# Patient Record
Sex: Female | Born: 1965 | Race: Black or African American | Hispanic: No | Marital: Married | Smoking: Never smoker
Health system: Southern US, Community
[De-identification: ages and names within clinical notes are randomized; demographics above are authoritative.]

## PROBLEM LIST (undated history)

## (undated) DIAGNOSIS — I10 Essential (primary) hypertension: Secondary | ICD-10-CM

---

## 2017-06-15 ENCOUNTER — Other Ambulatory Visit: Payer: Self-pay

## 2017-06-15 ENCOUNTER — Emergency Department (HOSPITAL_BASED_OUTPATIENT_CLINIC_OR_DEPARTMENT_OTHER)
Admission: EM | Admit: 2017-06-15 | Discharge: 2017-06-16 | Disposition: A | Discharge status: Home / Self Care | Payer: No Typology Code available for payment source | Attending: Emergency Medicine | Admitting: Emergency Medicine

## 2017-06-15 ENCOUNTER — Emergency Department (HOSPITAL_BASED_OUTPATIENT_CLINIC_OR_DEPARTMENT_OTHER): Payer: No Typology Code available for payment source

## 2017-06-15 ENCOUNTER — Encounter (HOSPITAL_BASED_OUTPATIENT_CLINIC_OR_DEPARTMENT_OTHER): Payer: Self-pay | Admitting: Emergency Medicine

## 2017-06-15 DIAGNOSIS — Z79899 Other long term (current) drug therapy: Secondary | ICD-10-CM | POA: Diagnosis not present

## 2017-06-15 DIAGNOSIS — I10 Essential (primary) hypertension: Secondary | ICD-10-CM | POA: Diagnosis not present

## 2017-06-15 DIAGNOSIS — G4700 Insomnia, unspecified: Secondary | ICD-10-CM | POA: Diagnosis not present

## 2017-06-15 HISTORY — DX: Essential (primary) hypertension: I10

## 2017-06-15 LAB — CBC WITH DIFFERENTIAL/PLATELET
BASOS ABS: 0 10*3/uL (ref 0.0–0.1)
BASOS PCT: 0 %
Eosinophils Absolute: 0 10*3/uL (ref 0.0–0.7)
Eosinophils Relative: 0 %
HEMATOCRIT: 39 % (ref 36.0–46.0)
Hemoglobin: 13.3 g/dL (ref 12.0–15.0)
LYMPHS PCT: 23 %
Lymphs Abs: 3 10*3/uL (ref 0.7–4.0)
MCH: 30.6 pg (ref 26.0–34.0)
MCHC: 34.1 g/dL (ref 30.0–36.0)
MCV: 89.9 fL (ref 78.0–100.0)
Monocytes Absolute: 0.9 10*3/uL (ref 0.1–1.0)
Monocytes Relative: 7 %
NEUTROS ABS: 9.3 10*3/uL — AB (ref 1.7–7.7)
NEUTROS PCT: 70 %
Platelets: 249 10*3/uL (ref 150–400)
RBC: 4.34 MIL/uL (ref 3.87–5.11)
RDW: 12.9 % (ref 11.5–15.5)
WBC: 13.2 10*3/uL — AB (ref 4.0–10.5)

## 2017-06-15 LAB — COMPREHENSIVE METABOLIC PANEL
ALBUMIN: 3.7 g/dL (ref 3.5–5.0)
ALT: 25 U/L (ref 14–54)
AST: 23 U/L (ref 15–41)
Alkaline Phosphatase: 46 U/L (ref 38–126)
Anion gap: 9 (ref 5–15)
BILIRUBIN TOTAL: 0.4 mg/dL (ref 0.3–1.2)
BUN: 16 mg/dL (ref 6–20)
CHLORIDE: 107 mmol/L (ref 101–111)
CO2: 23 mmol/L (ref 22–32)
CREATININE: 0.9 mg/dL (ref 0.44–1.00)
Calcium: 8.6 mg/dL — ABNORMAL LOW (ref 8.9–10.3)
GFR calc Af Amer: >60 mL/min (ref >60)
GLUCOSE: 122 mg/dL — AB (ref 65–99)
POTASSIUM: 3.6 mmol/L (ref 3.5–5.1)
Sodium: 139 mmol/L (ref 135–145)
Total Protein: 6.8 g/dL (ref 6.5–8.1)

## 2017-06-15 LAB — TROPONIN I

## 2017-06-15 MED ORDER — ZOLPIDEM TARTRATE ER 6.25 MG PO TBCR: 6.2500 mg | EXTENDED_RELEASE_TABLET | Freq: Every evening | ORAL | 0 refills | Status: AC | PRN

## 2017-06-15 MED ORDER — LORAZEPAM 1 MG PO TABS
0.5000 mg | ORAL_TABLET | Freq: Once | ORAL | Status: AC
  Administered 2017-06-15: 0.5 mg via ORAL
  Filled 2017-06-15: qty 1

## 2017-06-15 NOTE — ED Provider Notes (Signed)
[facility] EMERGENCY DEPARTMENT Provider Note   CSN: [id number] Arrival date & time: 06/15/17  1803     History   Chief Complaint Chief Complaint  Patient presents with  . Hypertension    HPI Anna Sullivan is a 52 y.o. female.  Pt presents to the ED today with elevated bp.  Pt ordered some diet pills from [employer] about 1 month ago.  She stopped taking them, but her bp remained high.  She has been on atenolol 50 mg daily and amlodipine 5 mg was started on Thursday the 24th.  The pt comes in tonight b/c her bp was still a little high and she had a funny feeling in her left upper chest.      Past Medical History:  Diagnosis Date  . Hypertension     There are no active problems to display for this patient.   History reviewed. No pertinent surgical history.  OB History    No data available       Home Medications    Prior to Admission medications   Medication Sig Start Date End Date Taking? Authorizing Provider  amLODipine (NORVASC) 5 MG tablet Take 5 mg by mouth daily.   Yes [provider]  atenolol (TENORMIN) 50 MG tablet Take 50 mg by mouth daily.   Yes [provider]  zolpidem (AMBIEN CR) 6.25 MG CR tablet Take 1 tablet (6.25 mg total) by mouth at bedtime as needed for sleep. 06/15/17   Jacalyn Lefevre, MD    Family History No family history on file.  Social History Social History   Tobacco Use  . Smoking status: Never Smoker  . Smokeless tobacco: Never Used  Substance Use Topics  . Alcohol use: No    Frequency: Never  . Drug use: No     Allergies   Patient has no known allergies.   Review of Systems Review of Systems  Cardiovascular: Positive for chest pain.  All other systems reviewed and are negative.    Physical Exam Updated Vital Signs BP 139/68   Pulse 82   Temp 98.4 F (36.9 C) (Oral)   Resp 16   Ht 5\' 4"  (1.626 m)   Wt 90.7 kg (200 lb)   LMP 06/08/2017   SpO2 98%   BMI 34.33 kg/m   Physical  Exam  Constitutional: She is oriented to person, place, and time. She appears well-developed and well-nourished.  HENT:  Head: Normocephalic and atraumatic.  Right Ear: External ear normal.  Left Ear: External ear normal.  Nose: Nose normal.  Mouth/Throat: Oropharynx is clear and moist.  Eyes: Conjunctivae and EOM are normal. Pupils are equal, round, and reactive to light.  Neck: Normal range of motion. Neck supple.  Cardiovascular: Normal rate, regular rhythm, normal heart sounds and intact distal pulses.  Pulmonary/Chest: Effort normal and breath sounds normal.  Abdominal: Soft. Bowel sounds are normal.  Musculoskeletal: Normal range of motion.  Neurological: She is alert and oriented to person, place, and time.  Skin: Skin is warm and dry. Capillary refill takes less than 2 seconds.  Psychiatric: She has a normal mood and affect. Her behavior is normal. Judgment and thought content normal.  Nursing note and vitals reviewed.    ED Treatments / Results  Labs (all labs ordered are listed, but only abnormal results are displayed) Labs Reviewed  CBC WITH DIFFERENTIAL/PLATELET - Abnormal; Notable for the following components:      Result Value   WBC 13.2 (*)  Neutro Abs 9.3 (*)    All other components within normal limits  COMPREHENSIVE METABOLIC PANEL - Abnormal; Notable for the following components:   Glucose, Bld 122 (*)    Calcium 8.6 (*)    All other components within normal limits  TROPONIN I    EKG  EKG Interpretation  Date/Time:  "Sunday June 15 2017 18:37:54 EST Ventricular Rate:  99 PR Interval:  178 QRS Duration: 78 QT Interval:  362 QTC Calculation: 464 R Axis:   35 Text Interpretation:  Normal sinus rhythm Cannot rule out Anterior infarct , age undetermined Abnormal ECG Confirmed by Lorelie Biermann ([id number]) on 06/15/2017 7:01:27 PM       Radiology Dg Chest 2 View  Result Date: 06/15/2017 CLINICAL DATA:  Increased hypertension over 2 and half weeks.  Nonsmoker. Chest pain. EXAM: CHEST  2 VIEW COMPARISON:  None. FINDINGS: The heart size and mediastinal contours are within normal limits. Both lungs are clear. The visualized skeletal structures are unremarkable. IMPRESSION: No active cardiopulmonary disease. Electronically Signed   By: William  Stevens M.D.   On: 06/15/2017 21:24    Procedures Procedures (including critical care time)  Medications Ordered in ED Medications  LORazepam (ATIVAN) tablet 0.5 mg (not administered)     Initial Impression / Assessment and Plan / ED Course  I have reviewed the triage vital signs and the nursing notes.  Pertinent labs & imaging results that were available during my care of the patient were reviewed by me and considered in my medical decision making (see chart for details).    Pt's bp is good here with rest.  Pt encouraged to keep taking current bp med regimen.  She said she's had trouble sleeping, so requested some meds to help her sleep.  She said she feels anxious.  She is given an ativan 0.5 prior to d/c and given rx for ambien.  She has an appt with her doctor on 2/7 for a f/u.  She knows to return if worse.  Final Clinical Impressions(s) / ED Diagnoses   Final diagnoses:  Essential hypertension  Insomnia, unspecified type    ED Discharge Orders        Ordered    zolpidem (AMBIEN CR) 6.25 MG CR tablet  At bedtime PRN     01" /27/19 2215       Jacalyn LefevreHaviland, Dillard Pascal, MD 06/15/17 2216

## 2017-06-15 NOTE — ED Notes (Signed)
EDP into room 

## 2017-06-15 NOTE — ED Notes (Signed)
Pt given d/c instructions as per chart. Verbalizes understanding. No questions. Rx x 1 with precautions 

## 2017-06-15 NOTE — Discharge Instructions (Signed)
Continue current blood pressure medications 

## 2017-06-15 NOTE — ED Provider Notes (Signed)
[facility] EMERGENCY DEPARTMENT Provider Note   CSN: [id number] Arrival date & time: 06/15/17  1803     History   Chief Complaint Chief Complaint  Patient presents with  . Hypertension    HPI Anna Sullivan is a 52 y.o. female.  HPI  Past Medical History:  Diagnosis Date  . Hypertension     There are no active problems to display for this patient.   History reviewed. No pertinent surgical history.  OB History    No data available       Home Medications    Prior to Admission medications   Medication Sig Start Date End Date Taking? Authorizing Provider  amLODipine (NORVASC) 5 MG tablet Take 5 mg by mouth daily.   Yes [provider]  atenolol (TENORMIN) 50 MG tablet Take 50 mg by mouth daily.   Yes [provider]  zolpidem (AMBIEN CR) 6.25 MG CR tablet Take 1 tablet (6.25 mg total) by mouth at bedtime as needed for sleep. 06/15/17   Jacalyn Lefevre, MD    Family History No family history on file.  Social History Social History   Tobacco Use  . Smoking status: Never Smoker  . Smokeless tobacco: Never Used  Substance Use Topics  . Alcohol use: No    Frequency: Never  . Drug use: No     Allergies   Patient has no known allergies.   Review of Systems Review of Systems   Physical Exam Updated Vital Signs BP 136/75   Pulse 67   Temp 98.4 F (36.9 C) (Oral)   Resp 16   Ht 5\' 4"  (1.626 m)   Wt 90.7 kg (200 lb)   LMP 06/08/2017   SpO2 98%   BMI 34.33 kg/m   Physical Exam   ED Treatments / Results  Labs (all labs ordered are listed, but only abnormal results are displayed) Labs Reviewed  CBC WITH DIFFERENTIAL/PLATELET - Abnormal; Notable for the following components:      Result Value   WBC 13.2 (*)    Neutro Abs 9.3 (*)    All other components within normal limits  COMPREHENSIVE METABOLIC PANEL - Abnormal; Notable for the following components:   Glucose, Bld 122 (*)    Calcium 8.6 (*)    All other  components within normal limits  TROPONIN I    EKG  EKG Interpretation  Date/Time:  Sunday June 15 2017 18:37:54 EST Ventricular Rate:  99 PR Interval:  178 QRS Duration: 78 QT Interval:  362 QTC Calculation: 464 R Axis:   35 Text Interpretation:  Normal sinus rhythm Cannot rule out Anterior infarct , age undetermined Abnormal ECG Confirmed by Jacalyn Lefevre [phone]) on 06/15/2017 7:01:27 PM       Radiology No results found.  Procedures Procedures (including critical care time)  Medications Ordered in ED Medications  LORazepam (ATIVAN) tablet 0.5 mg (0.5 mg Oral Given 06/15/17 2312)     Initial Impression / Assessment and Plan / ED Course  I have reviewed the triage vital signs and the nursing notes.  Pertinent labs & imaging results that were available during my care of the patient were reviewed by me and considered in my medical decision making (see chart for details).   BP improved with rest.  No additional bp meds given here.  Pt c/o anxiety and insomnia, so she was given 1 dose of ativan here and d/c with ambien.  She is instructed to f/u with pcp and to return if  worse.   Final Clinical Impressions(s) / ED Diagnoses   Final diagnoses:  Essential hypertension  Insomnia, unspecified type    ED Discharge Orders        Ordered    zolpidem (AMBIEN CR) 6.25 MG CR tablet  At bedtime PRN     06/15/17 2215       Jacalyn LefevreHaviland, Jermain Curt, MD 06/22/17 1647

## 2017-06-15 NOTE — ED Triage Notes (Addendum)
Pt c/o elevated BP; sts she has been taking diet medication she ordered online; new rx for amlodipine 5mg , started Thurs; also reports funny feeling "like numbness" in LUE and LT upper chest

## 2017-06-15 NOTE — ED Notes (Signed)
Pt not in room, pt to xray on stretcher. Alert, NAD, calm, interactive, resps e/u, speaking in clear complete sentences, no dyspnea noted, skin W&D, VSS, admits to "nervousness", (denies: pain, sob, HA, nausea, dizziness or visual changes). [facility]

## 2018-03-25 IMAGING — CR DG CHEST 2V
2 series · 2 of 2 positions shown · non-contrast
Comparison: None.

CLINICAL DATA: Increased hypertension over 2 and half weeks.
Nonsmoker. Chest pain.

EXAM:
CHEST  2 VIEW

[w chest pa]
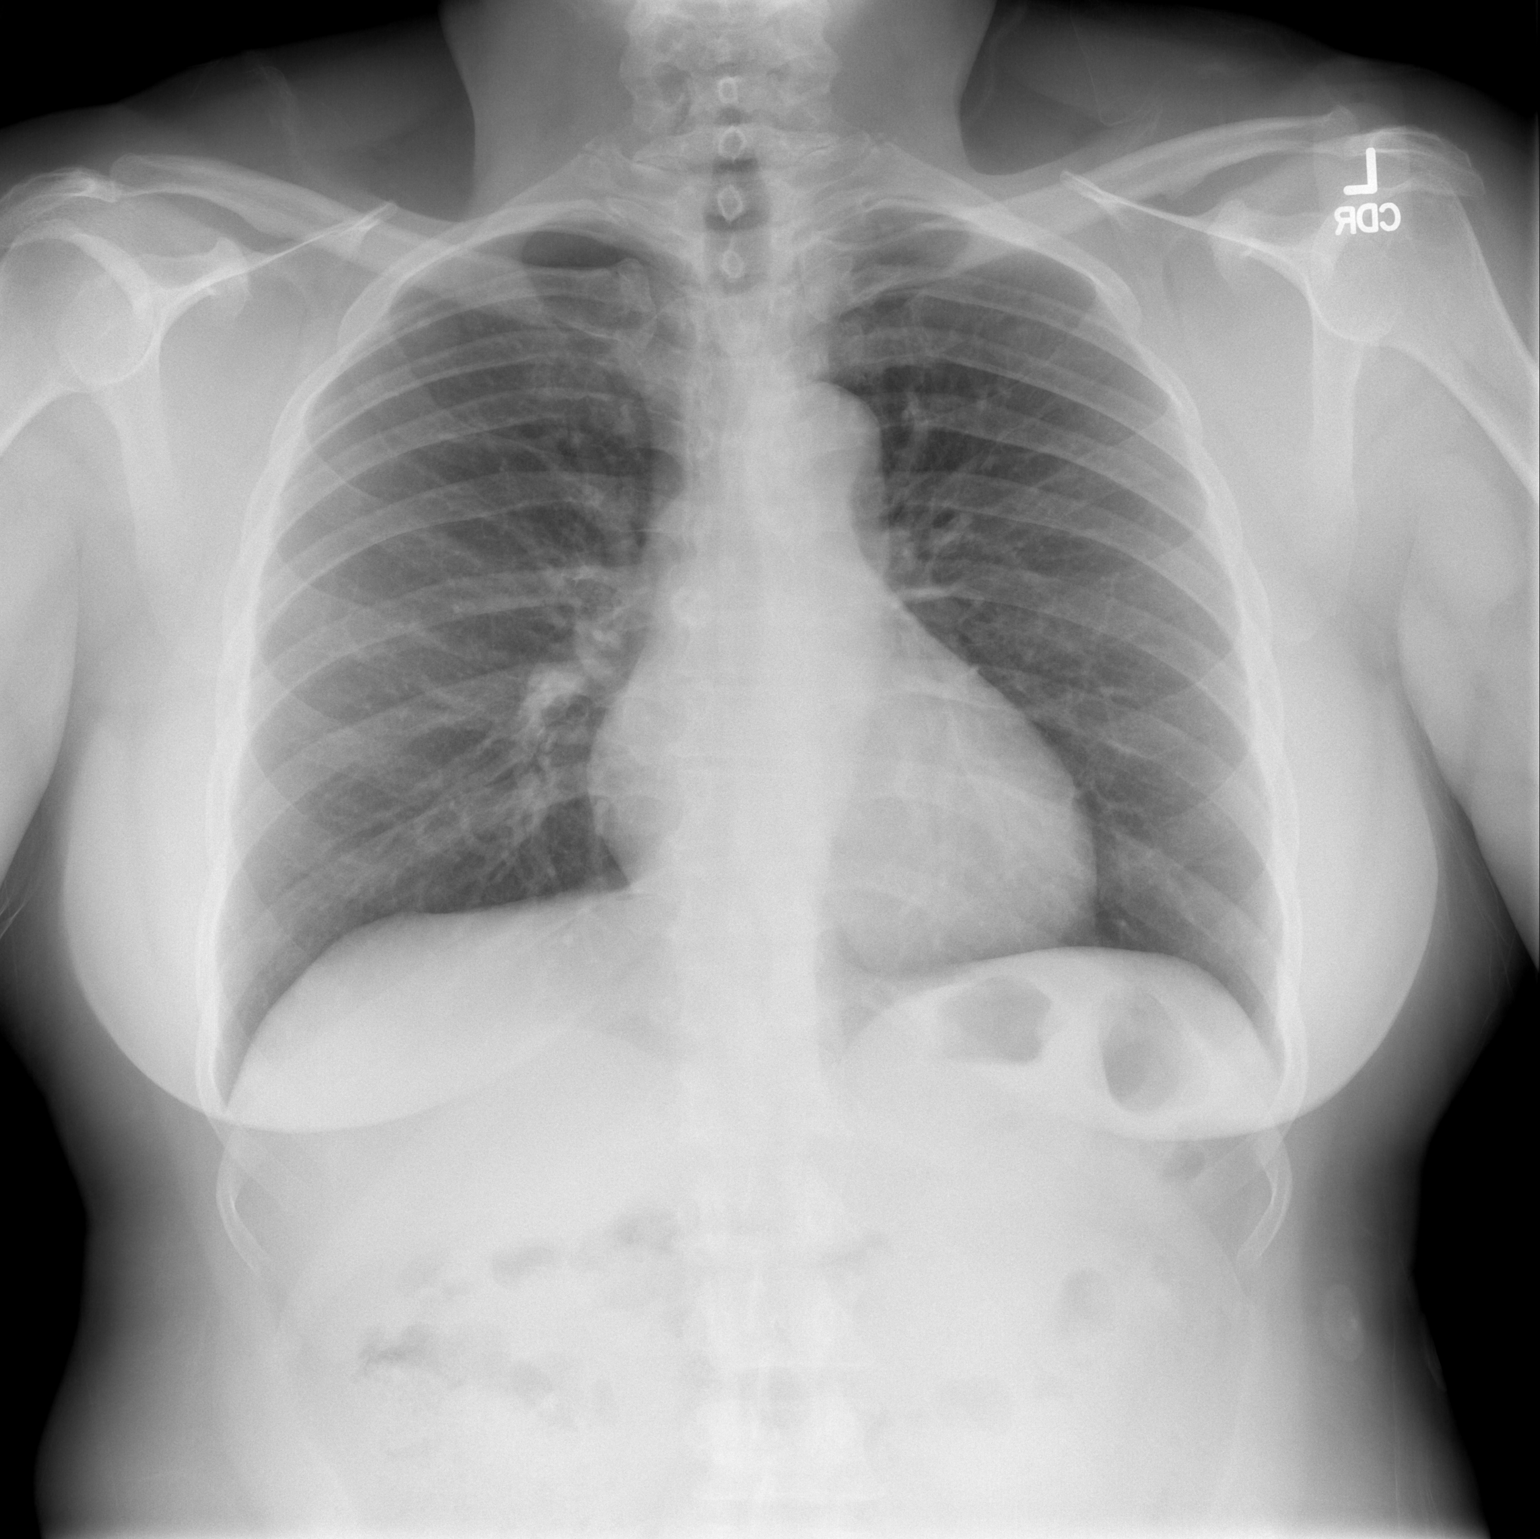

[w chest lat]
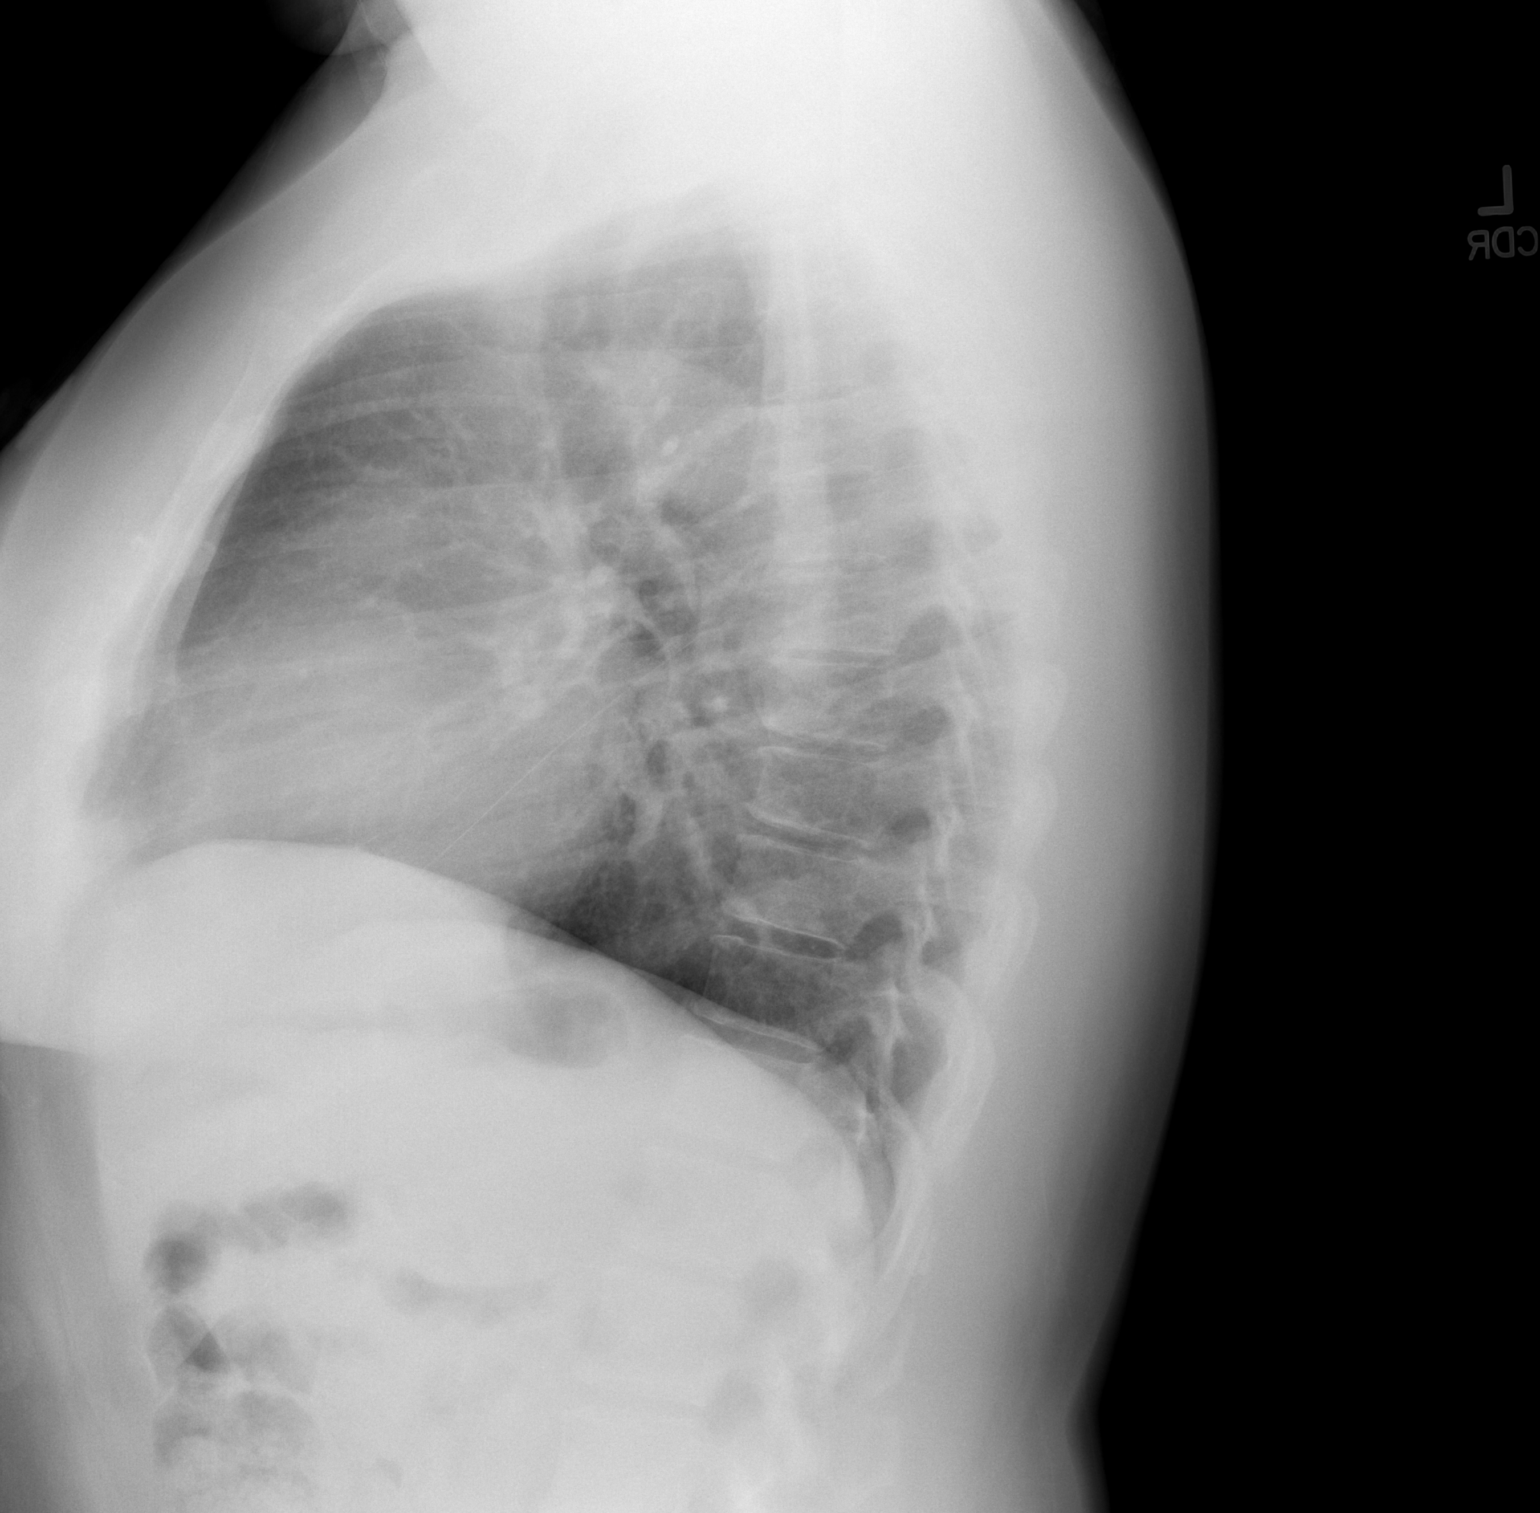

[2 of 2 positions shown; findings below may reference images not displayed]

FINDINGS: The heart size and mediastinal contours are within normal limits.
Both lungs are clear. The visualized skeletal structures are
unremarkable.
IMPRESSION: No active cardiopulmonary disease.
